# Patient Record
Sex: Female | Born: 1945 | Race: White | Hispanic: No | Marital: Married
Health system: Southern US, Community
[De-identification: ages and names within clinical notes are randomized; demographics above are authoritative.]

## PROBLEM LIST (undated history)

## (undated) DIAGNOSIS — C8590 Non-Hodgkin lymphoma, unspecified, unspecified site: Secondary | ICD-10-CM

---

## 2012-01-06 ENCOUNTER — Other Ambulatory Visit (HOSPITAL_COMMUNITY): Payer: Self-pay | Admitting: Internal Medicine

## 2012-01-06 DIAGNOSIS — C8590 Non-Hodgkin lymphoma, unspecified, unspecified site: Secondary | ICD-10-CM

## 2012-01-13 ENCOUNTER — Encounter (HOSPITAL_COMMUNITY): Payer: Self-pay

## 2012-01-13 ENCOUNTER — Encounter (HOSPITAL_COMMUNITY)
Admission: RE | Admit: 2012-01-13 | Discharge: 2012-01-13 | Disposition: A | Discharge status: Home / Self Care | Payer: Managed Care, Other (non HMO) | Source: Ambulatory Visit | Attending: Internal Medicine | Admitting: Internal Medicine

## 2012-01-13 DIAGNOSIS — R911 Solitary pulmonary nodule: Secondary | ICD-10-CM | POA: Insufficient documentation

## 2012-01-13 DIAGNOSIS — R161 Splenomegaly, not elsewhere classified: Secondary | ICD-10-CM | POA: Insufficient documentation

## 2012-01-13 DIAGNOSIS — C8590 Non-Hodgkin lymphoma, unspecified, unspecified site: Secondary | ICD-10-CM

## 2012-01-13 DIAGNOSIS — C8589 Other specified types of non-Hodgkin lymphoma, extranodal and solid organ sites: Secondary | ICD-10-CM | POA: Insufficient documentation

## 2012-01-13 HISTORY — DX: Non-Hodgkin lymphoma, unspecified, unspecified site: C85.90

## 2012-01-13 MED ORDER — FLUDEOXYGLUCOSE F - 18 (FDG) INJECTION
16.0000 | Freq: Once | INTRAVENOUS | Status: AC | PRN
  Administered 2012-01-13: 16.3 via INTRAVENOUS

## 2012-02-17 ENCOUNTER — Encounter: Payer: Managed Care, Other (non HMO) | Admitting: Internal Medicine

## 2012-02-17 DIAGNOSIS — C8589 Other specified types of non-Hodgkin lymphoma, extranodal and solid organ sites: Secondary | ICD-10-CM

## 2012-02-17 DIAGNOSIS — D709 Neutropenia, unspecified: Secondary | ICD-10-CM

## 2012-02-23 ENCOUNTER — Encounter: Payer: Managed Care, Other (non HMO) | Admitting: Internal Medicine

## 2012-02-23 DIAGNOSIS — D759 Disease of blood and blood-forming organs, unspecified: Secondary | ICD-10-CM

## 2012-02-23 DIAGNOSIS — C8589 Other specified types of non-Hodgkin lymphoma, extranodal and solid organ sites: Secondary | ICD-10-CM

## 2012-02-23 DIAGNOSIS — K219 Gastro-esophageal reflux disease without esophagitis: Secondary | ICD-10-CM

## 2012-03-06 ENCOUNTER — Encounter: Payer: Managed Care, Other (non HMO) | Admitting: Internal Medicine

## 2012-03-06 DIAGNOSIS — D473 Essential (hemorrhagic) thrombocythemia: Secondary | ICD-10-CM

## 2012-03-06 DIAGNOSIS — C8589 Other specified types of non-Hodgkin lymphoma, extranodal and solid organ sites: Secondary | ICD-10-CM

## 2012-03-12 ENCOUNTER — Encounter: Payer: Managed Care, Other (non HMO) | Admitting: Internal Medicine

## 2012-03-12 DIAGNOSIS — C8589 Other specified types of non-Hodgkin lymphoma, extranodal and solid organ sites: Secondary | ICD-10-CM

## 2012-03-12 DIAGNOSIS — Z5112 Encounter for antineoplastic immunotherapy: Secondary | ICD-10-CM

## 2012-03-12 DIAGNOSIS — F329 Major depressive disorder, single episode, unspecified: Secondary | ICD-10-CM

## 2012-03-13 ENCOUNTER — Encounter: Payer: Managed Care, Other (non HMO) | Admitting: Internal Medicine

## 2012-03-13 DIAGNOSIS — F329 Major depressive disorder, single episode, unspecified: Secondary | ICD-10-CM

## 2012-03-13 DIAGNOSIS — C8589 Other specified types of non-Hodgkin lymphoma, extranodal and solid organ sites: Secondary | ICD-10-CM

## 2012-04-02 ENCOUNTER — Encounter: Payer: Managed Care, Other (non HMO) | Admitting: Internal Medicine

## 2012-04-02 DIAGNOSIS — D696 Thrombocytopenia, unspecified: Secondary | ICD-10-CM

## 2012-04-02 DIAGNOSIS — C8589 Other specified types of non-Hodgkin lymphoma, extranodal and solid organ sites: Secondary | ICD-10-CM

## 2012-04-02 DIAGNOSIS — D72819 Decreased white blood cell count, unspecified: Secondary | ICD-10-CM

## 2012-04-02 DIAGNOSIS — F329 Major depressive disorder, single episode, unspecified: Secondary | ICD-10-CM

## 2012-04-03 DIAGNOSIS — C8589 Other specified types of non-Hodgkin lymphoma, extranodal and solid organ sites: Secondary | ICD-10-CM

## 2012-04-03 DIAGNOSIS — Z5112 Encounter for antineoplastic immunotherapy: Secondary | ICD-10-CM

## 2012-04-06 ENCOUNTER — Encounter: Payer: Managed Care, Other (non HMO) | Admitting: Internal Medicine

## 2012-04-06 DIAGNOSIS — F411 Generalized anxiety disorder: Secondary | ICD-10-CM

## 2012-04-06 DIAGNOSIS — C8589 Other specified types of non-Hodgkin lymphoma, extranodal and solid organ sites: Secondary | ICD-10-CM

## 2012-04-24 ENCOUNTER — Encounter: Payer: Managed Care, Other (non HMO) | Admitting: Internal Medicine

## 2012-04-24 DIAGNOSIS — Z5111 Encounter for antineoplastic chemotherapy: Secondary | ICD-10-CM

## 2012-04-24 DIAGNOSIS — C8589 Other specified types of non-Hodgkin lymphoma, extranodal and solid organ sites: Secondary | ICD-10-CM

## 2012-04-25 ENCOUNTER — Encounter: Payer: Managed Care, Other (non HMO) | Admitting: Internal Medicine

## 2012-04-25 DIAGNOSIS — C8589 Other specified types of non-Hodgkin lymphoma, extranodal and solid organ sites: Secondary | ICD-10-CM

## 2012-04-25 DIAGNOSIS — Z5111 Encounter for antineoplastic chemotherapy: Secondary | ICD-10-CM

## 2012-05-02 ENCOUNTER — Encounter: Payer: Managed Care, Other (non HMO) | Admitting: Internal Medicine

## 2012-05-02 DIAGNOSIS — R110 Nausea: Secondary | ICD-10-CM

## 2012-05-02 DIAGNOSIS — F329 Major depressive disorder, single episode, unspecified: Secondary | ICD-10-CM

## 2012-05-02 DIAGNOSIS — C8589 Other specified types of non-Hodgkin lymphoma, extranodal and solid organ sites: Secondary | ICD-10-CM

## 2012-05-29 ENCOUNTER — Encounter: Payer: Managed Care, Other (non HMO) | Admitting: Internal Medicine

## 2012-05-29 DIAGNOSIS — C9140 Hairy cell leukemia not having achieved remission: Secondary | ICD-10-CM

## 2012-05-29 DIAGNOSIS — Z5111 Encounter for antineoplastic chemotherapy: Secondary | ICD-10-CM

## 2012-05-29 DIAGNOSIS — K219 Gastro-esophageal reflux disease without esophagitis: Secondary | ICD-10-CM

## 2012-05-29 DIAGNOSIS — Z5112 Encounter for antineoplastic immunotherapy: Secondary | ICD-10-CM

## 2012-05-30 DIAGNOSIS — Z5111 Encounter for antineoplastic chemotherapy: Secondary | ICD-10-CM

## 2012-05-30 DIAGNOSIS — C8589 Other specified types of non-Hodgkin lymphoma, extranodal and solid organ sites: Secondary | ICD-10-CM

## 2012-06-05 DIAGNOSIS — C8589 Other specified types of non-Hodgkin lymphoma, extranodal and solid organ sites: Secondary | ICD-10-CM

## 2012-06-13 ENCOUNTER — Encounter: Payer: Managed Care, Other (non HMO) | Admitting: Internal Medicine

## 2012-06-13 DIAGNOSIS — C8589 Other specified types of non-Hodgkin lymphoma, extranodal and solid organ sites: Secondary | ICD-10-CM

## 2012-06-13 DIAGNOSIS — R5381 Other malaise: Secondary | ICD-10-CM

## 2012-06-13 DIAGNOSIS — F329 Major depressive disorder, single episode, unspecified: Secondary | ICD-10-CM

## 2012-06-13 DIAGNOSIS — R5383 Other fatigue: Secondary | ICD-10-CM

## 2012-07-06 ENCOUNTER — Encounter: Payer: Managed Care, Other (non HMO) | Admitting: Internal Medicine

## 2012-07-06 DIAGNOSIS — C8589 Other specified types of non-Hodgkin lymphoma, extranodal and solid organ sites: Secondary | ICD-10-CM

## 2012-07-31 ENCOUNTER — Encounter: Payer: Managed Care, Other (non HMO) | Admitting: Internal Medicine

## 2012-07-31 DIAGNOSIS — C8319 Mantle cell lymphoma, extranodal and solid organ sites: Secondary | ICD-10-CM

## 2012-07-31 DIAGNOSIS — F329 Major depressive disorder, single episode, unspecified: Secondary | ICD-10-CM

## 2012-09-11 DIAGNOSIS — Z5112 Encounter for antineoplastic immunotherapy: Secondary | ICD-10-CM

## 2012-09-11 DIAGNOSIS — C8589 Other specified types of non-Hodgkin lymphoma, extranodal and solid organ sites: Secondary | ICD-10-CM

## 2012-10-04 ENCOUNTER — Encounter: Payer: Managed Care, Other (non HMO) | Admitting: Internal Medicine

## 2012-10-04 DIAGNOSIS — N390 Urinary tract infection, site not specified: Secondary | ICD-10-CM

## 2012-10-04 DIAGNOSIS — R05 Cough: Secondary | ICD-10-CM

## 2012-10-04 DIAGNOSIS — C8589 Other specified types of non-Hodgkin lymphoma, extranodal and solid organ sites: Secondary | ICD-10-CM

## 2012-11-08 ENCOUNTER — Encounter: Payer: Managed Care, Other (non HMO) | Admitting: Internal Medicine

## 2012-11-08 DIAGNOSIS — R197 Diarrhea, unspecified: Secondary | ICD-10-CM

## 2012-11-08 DIAGNOSIS — E860 Dehydration: Secondary | ICD-10-CM

## 2012-11-08 DIAGNOSIS — C8589 Other specified types of non-Hodgkin lymphoma, extranodal and solid organ sites: Secondary | ICD-10-CM

## 2012-11-14 DIAGNOSIS — C8589 Other specified types of non-Hodgkin lymphoma, extranodal and solid organ sites: Secondary | ICD-10-CM

## 2012-11-21 DIAGNOSIS — Z5112 Encounter for antineoplastic immunotherapy: Secondary | ICD-10-CM

## 2012-11-21 DIAGNOSIS — C8589 Other specified types of non-Hodgkin lymphoma, extranodal and solid organ sites: Secondary | ICD-10-CM

## 2013-01-02 ENCOUNTER — Encounter: Payer: Managed Care, Other (non HMO) | Admitting: Internal Medicine

## 2013-01-02 DIAGNOSIS — Z5112 Encounter for antineoplastic immunotherapy: Secondary | ICD-10-CM

## 2013-01-02 DIAGNOSIS — C8589 Other specified types of non-Hodgkin lymphoma, extranodal and solid organ sites: Secondary | ICD-10-CM

## 2013-02-21 ENCOUNTER — Encounter: Payer: Managed Care, Other (non HMO) | Admitting: Internal Medicine

## 2013-02-21 DIAGNOSIS — C8319 Mantle cell lymphoma, extranodal and solid organ sites: Secondary | ICD-10-CM

## 2013-02-27 DIAGNOSIS — C8319 Mantle cell lymphoma, extranodal and solid organ sites: Secondary | ICD-10-CM

## 2013-02-27 DIAGNOSIS — Z5112 Encounter for antineoplastic immunotherapy: Secondary | ICD-10-CM

## 2013-03-19 ENCOUNTER — Encounter: Payer: Managed Care, Other (non HMO) | Admitting: Internal Medicine

## 2013-03-19 DIAGNOSIS — C8319 Mantle cell lymphoma, extranodal and solid organ sites: Secondary | ICD-10-CM

## 2013-03-19 DIAGNOSIS — D696 Thrombocytopenia, unspecified: Secondary | ICD-10-CM

## 2013-04-24 DIAGNOSIS — Z5112 Encounter for antineoplastic immunotherapy: Secondary | ICD-10-CM

## 2013-04-24 DIAGNOSIS — C8589 Other specified types of non-Hodgkin lymphoma, extranodal and solid organ sites: Secondary | ICD-10-CM

## 2013-06-21 ENCOUNTER — Ambulatory Visit (INDEPENDENT_AMBULATORY_CARE_PROVIDER_SITE_OTHER): Payer: Managed Care, Other (non HMO) | Admitting: Urology

## 2013-06-21 DIAGNOSIS — C8319 Mantle cell lymphoma, extranodal and solid organ sites: Secondary | ICD-10-CM

## 2013-06-21 DIAGNOSIS — N952 Postmenopausal atrophic vaginitis: Secondary | ICD-10-CM

## 2013-06-21 DIAGNOSIS — D45 Polycythemia vera: Secondary | ICD-10-CM

## 2013-06-21 DIAGNOSIS — N3020 Other chronic cystitis without hematuria: Secondary | ICD-10-CM

## 2013-07-02 DIAGNOSIS — C8589 Other specified types of non-Hodgkin lymphoma, extranodal and solid organ sites: Secondary | ICD-10-CM

## 2013-07-02 DIAGNOSIS — Z5112 Encounter for antineoplastic immunotherapy: Secondary | ICD-10-CM

## 2013-07-22 DIAGNOSIS — C8319 Mantle cell lymphoma, extranodal and solid organ sites: Secondary | ICD-10-CM

## 2013-07-22 DIAGNOSIS — R110 Nausea: Secondary | ICD-10-CM

## 2013-07-22 DIAGNOSIS — R198 Other specified symptoms and signs involving the digestive system and abdomen: Secondary | ICD-10-CM

## 2013-07-22 DIAGNOSIS — R1310 Dysphagia, unspecified: Secondary | ICD-10-CM

## 2013-07-26 ENCOUNTER — Ambulatory Visit (INDEPENDENT_AMBULATORY_CARE_PROVIDER_SITE_OTHER): Payer: Managed Care, Other (non HMO) | Admitting: Urology

## 2013-07-26 ENCOUNTER — Encounter (INDEPENDENT_AMBULATORY_CARE_PROVIDER_SITE_OTHER): Payer: Self-pay

## 2013-07-26 DIAGNOSIS — N3020 Other chronic cystitis without hematuria: Secondary | ICD-10-CM

## 2013-07-26 DIAGNOSIS — N952 Postmenopausal atrophic vaginitis: Secondary | ICD-10-CM

## 2013-08-05 DIAGNOSIS — C8319 Mantle cell lymphoma, extranodal and solid organ sites: Secondary | ICD-10-CM

## 2013-08-27 DIAGNOSIS — C8319 Mantle cell lymphoma, extranodal and solid organ sites: Secondary | ICD-10-CM

## 2013-08-27 DIAGNOSIS — Z5112 Encounter for antineoplastic immunotherapy: Secondary | ICD-10-CM

## 2013-10-25 ENCOUNTER — Ambulatory Visit (INDEPENDENT_AMBULATORY_CARE_PROVIDER_SITE_OTHER): Payer: 59 | Admitting: Urology

## 2013-10-25 DIAGNOSIS — N952 Postmenopausal atrophic vaginitis: Secondary | ICD-10-CM

## 2013-10-25 DIAGNOSIS — N3020 Other chronic cystitis without hematuria: Secondary | ICD-10-CM

## 2014-04-23 ENCOUNTER — Other Ambulatory Visit (HOSPITAL_COMMUNITY): Payer: Self-pay | Admitting: Oncology

## 2014-04-23 DIAGNOSIS — C8590 Non-Hodgkin lymphoma, unspecified, unspecified site: Secondary | ICD-10-CM

## 2014-06-06 ENCOUNTER — Ambulatory Visit (INDEPENDENT_AMBULATORY_CARE_PROVIDER_SITE_OTHER): Payer: 59 | Admitting: Urology

## 2014-06-06 DIAGNOSIS — N3020 Other chronic cystitis without hematuria: Secondary | ICD-10-CM

## 2014-06-06 DIAGNOSIS — R310 Gross hematuria: Secondary | ICD-10-CM

## 2014-06-13 ENCOUNTER — Ambulatory Visit (INDEPENDENT_AMBULATORY_CARE_PROVIDER_SITE_OTHER): Payer: 59 | Admitting: Urology

## 2014-06-13 DIAGNOSIS — R310 Gross hematuria: Secondary | ICD-10-CM

## 2014-07-22 ENCOUNTER — Ambulatory Visit (HOSPITAL_COMMUNITY)
Admission: RE | Admit: 2014-07-22 | Discharge: 2014-07-22 | Disposition: A | Discharge status: Home / Self Care | Payer: 59 | Source: Ambulatory Visit | Attending: Oncology | Admitting: Oncology

## 2014-07-22 DIAGNOSIS — C8590 Non-Hodgkin lymphoma, unspecified, unspecified site: Secondary | ICD-10-CM | POA: Diagnosis not present

## 2014-07-22 DIAGNOSIS — Z90710 Acquired absence of both cervix and uterus: Secondary | ICD-10-CM | POA: Insufficient documentation

## 2014-07-22 DIAGNOSIS — I2510 Atherosclerotic heart disease of native coronary artery without angina pectoris: Secondary | ICD-10-CM | POA: Insufficient documentation

## 2014-07-22 DIAGNOSIS — R911 Solitary pulmonary nodule: Secondary | ICD-10-CM | POA: Diagnosis not present

## 2014-07-22 LAB — GLUCOSE, CAPILLARY: GLUCOSE-CAPILLARY: 102 mg/dL — AB (ref 70–99)

## 2014-07-22 MED ORDER — FLUDEOXYGLUCOSE F - 18 (FDG) INJECTION
9.5000 | Freq: Once | INTRAVENOUS | Status: AC | PRN
  Administered 2014-07-22: 9.5 via INTRAVENOUS

## 2014-07-25 ENCOUNTER — Ambulatory Visit (INDEPENDENT_AMBULATORY_CARE_PROVIDER_SITE_OTHER): Payer: 59 | Admitting: Urology

## 2014-07-25 DIAGNOSIS — N952 Postmenopausal atrophic vaginitis: Secondary | ICD-10-CM

## 2014-07-25 DIAGNOSIS — R310 Gross hematuria: Secondary | ICD-10-CM

## 2014-07-25 DIAGNOSIS — N3020 Other chronic cystitis without hematuria: Secondary | ICD-10-CM

## 2015-01-23 ENCOUNTER — Ambulatory Visit: Payer: Medicare Other | Admitting: Urology

## 2016-06-03 IMAGING — CT NM PET TUM IMG RESTAG (PS) SKULL BASE T - THIGH
1 of 8 series · 1 of 25 positions shown · non-contrast
Comparison: PET-CT 01/13/2012

CLINICAL DATA: Subsequent treatment strategy for non-Hodgkin's
lymphoma. Restaging examination.

EXAM:
NUCLEAR MEDICINE PET SKULL BASE TO THIGH
TECHNIQUE: 9.5 mCi F-18 FDG was injected intravenously. Full-ring PET imaging
was performed from the skull base to thigh after the radiotracer. CT
data was obtained and used for attenuation correction and anatomic
localization.
FASTING BLOOD GLUCOSE:  Value: 102 mg/dl

[Series 4: ct sk_thigh 5.0 hd_fov · axial · 5.0mm · 1.11mm/px · 1 of 227 slices shown]
[im 227/227  brain]
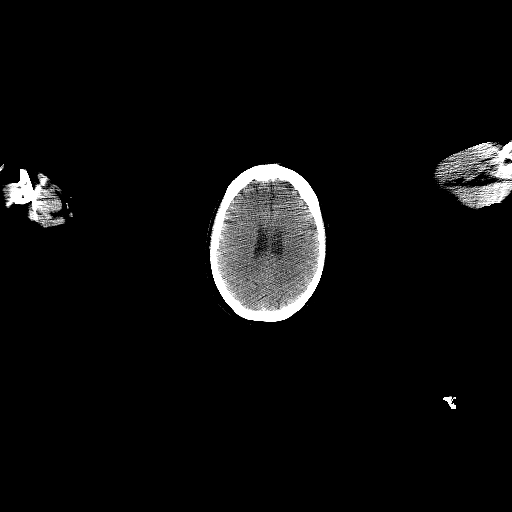

[1 of 25 positions shown; findings below may reference images not displayed]

FINDINGS: NECK

There is slightly asymmetric metabolic activity within the region of
the pharyngeal tonsils (SUVmax = 5.4 on the right and 3.8 on the
left)), without any associated mass-like area on the CT portion of
the examination. No hypermetabolic lymph nodes in the neck.

CHEST

No hypermetabolic mediastinal or hilar nodes. No suspicious
pulmonary nodules on the CT scan. Status post left axillary nodal
dissection. Right internal jugular single-lumen porta cath with tip
terminating in the distal superior vena cava. Heart size is normal.
There is no significant pericardial fluid, thickening or pericardial
calcification. There is atherosclerosis of the thoracic aorta, the
great vessels of the mediastinum and the coronary arteries,
including calcified atherosclerotic plaque in the left main, left
circumflex and right coronary arteries. No pathologically enlarged
mediastinal or hilar lymph nodes. Please note that accurate
exclusion of hilar adenopathy is limited on noncontrast CT scans.
Esophagus is unremarkable in appearance. Previously noted left lower
lobe nodule appears unchanged compared to prior studies measuring 6
mm on today's examination (image 39 of series 6), and is not
hypermetabolic. Small areas of scarring in the more inferior aspect
of the left lower lobe are unchanged.

ABDOMEN/PELVIS

No abnormal hypermetabolic activity within the liver, pancreas,
adrenal glands, or spleen. No hypermetabolic lymph nodes in the
abdomen or pelvis. Sub cm low-attenuation lesion in segment 2 of the
liver is incompletely characterized on today's non contrast CT
examination, but demonstrates no hypermetabolism and is similar to
prior studies, presumably a small benign lesion such as a small
cyst. Again noted is a large 8.2 x 8.1 cm low-attenuation lesion in
the upper pole of the left kidney, compatible with a large cyst. No
significant volume of ascites. No pneumoperitoneum. No pathologic
distention of small bowel. Status post hysterectomy. Ovaries are not
confidently identified may be surgically absent or atrophic.
Atherosclerosis throughout the abdominal and pelvic vasculature,
without evidence of aneurysm.

SKELETON

No focal hypermetabolic activity to suggest skeletal metastasis.
IMPRESSION: 1. Mild asymmetry of metabolic activity in the region of the
pharyngeal tonsils. This is highly nonspecific and may be
physiologic, but correlation with direct inspection is recommended.
2. No other findings to suggest recurrence of lymphoma in the neck,
chest, abdomen or pelvis.
3. Small 6 mm pulmonary nodule in the left lower lobe is unchanged
dating back to prior study from 01/13/2012, is not hypermetabolic,
and can be considered benign.
4. Atherosclerosis, including left main and 2 vessel coronary artery
disease. Please note that although the presence of coronary artery
calcium documents the presence of coronary artery disease, the
severity of this disease and any potential stenosis cannot be
assessed on this non-gated CT examination. Assessment for potential
risk factor modification, dietary therapy or pharmacologic therapy
may be warranted, if clinically indicated.
5. Additional incidental findings, as above.
